# Patient Record
Sex: Female | Born: 1970 | Race: White | Hispanic: No | Smoking: Never smoker
Health system: Southern US, Community
[De-identification: ages and names within clinical notes are randomized; demographics above are authoritative.]

## PROBLEM LIST (undated history)

## (undated) DIAGNOSIS — E119 Type 2 diabetes mellitus without complications: Secondary | ICD-10-CM

## (undated) DIAGNOSIS — C439 Malignant melanoma of skin, unspecified: Secondary | ICD-10-CM

## (undated) DIAGNOSIS — J45909 Unspecified asthma, uncomplicated: Secondary | ICD-10-CM

## (undated) HISTORY — PX: APPENDECTOMY: SHX54

---

## 2002-06-06 ENCOUNTER — Other Ambulatory Visit: Admission: RE | Admit: 2002-06-06 | Discharge: 2002-06-06 | Payer: Self-pay | Admitting: Endocrinology

## 2020-04-02 ENCOUNTER — Emergency Department (HOSPITAL_BASED_OUTPATIENT_CLINIC_OR_DEPARTMENT_OTHER): Payer: BC Managed Care – PPO

## 2020-04-02 ENCOUNTER — Encounter (HOSPITAL_BASED_OUTPATIENT_CLINIC_OR_DEPARTMENT_OTHER): Payer: Self-pay | Admitting: Emergency Medicine

## 2020-04-02 ENCOUNTER — Emergency Department (HOSPITAL_BASED_OUTPATIENT_CLINIC_OR_DEPARTMENT_OTHER)
Admission: EM | Admit: 2020-04-02 | Discharge: 2020-04-02 | Disposition: A | Discharge status: Home / Self Care | Payer: BC Managed Care – PPO | Attending: Emergency Medicine | Admitting: Emergency Medicine

## 2020-04-02 ENCOUNTER — Other Ambulatory Visit: Payer: Self-pay

## 2020-04-02 DIAGNOSIS — E119 Type 2 diabetes mellitus without complications: Secondary | ICD-10-CM | POA: Insufficient documentation

## 2020-04-02 DIAGNOSIS — R0789 Other chest pain: Secondary | ICD-10-CM

## 2020-04-02 DIAGNOSIS — R002 Palpitations: Secondary | ICD-10-CM | POA: Diagnosis not present

## 2020-04-02 DIAGNOSIS — C439 Malignant melanoma of skin, unspecified: Secondary | ICD-10-CM | POA: Insufficient documentation

## 2020-04-02 DIAGNOSIS — R079 Chest pain, unspecified: Secondary | ICD-10-CM | POA: Insufficient documentation

## 2020-04-02 DIAGNOSIS — E039 Hypothyroidism, unspecified: Secondary | ICD-10-CM

## 2020-04-02 DIAGNOSIS — J45909 Unspecified asthma, uncomplicated: Secondary | ICD-10-CM | POA: Diagnosis not present

## 2020-04-02 DIAGNOSIS — R42 Dizziness and giddiness: Secondary | ICD-10-CM | POA: Diagnosis not present

## 2020-04-02 HISTORY — DX: Type 2 diabetes mellitus without complications: E11.9

## 2020-04-02 HISTORY — DX: Unspecified asthma, uncomplicated: J45.909

## 2020-04-02 HISTORY — DX: Malignant melanoma of skin, unspecified: C43.9

## 2020-04-02 LAB — TROPONIN I (HIGH SENSITIVITY)
Troponin I (High Sensitivity): 2 ng/L (ref <18)
Troponin I (High Sensitivity): 3 ng/L (ref <18)

## 2020-04-02 LAB — BASIC METABOLIC PANEL
Anion gap: 11 (ref 5–15)
BUN: 12 mg/dL (ref 6–20)
CO2: 20 mmol/L — ABNORMAL LOW (ref 22–32)
Calcium: 8.8 mg/dL — ABNORMAL LOW (ref 8.9–10.3)
Chloride: 103 mmol/L (ref 98–111)
Creatinine, Ser: 0.76 mg/dL (ref 0.44–1.00)
GFR calc non Af Amer: >60 mL/min (ref >60)
Glucose, Bld: 137 mg/dL — ABNORMAL HIGH (ref 70–99)
Potassium: 3.6 mmol/L (ref 3.5–5.1)
Sodium: 134 mmol/L — ABNORMAL LOW (ref 135–145)

## 2020-04-02 LAB — CBC
HCT: 34.7 % — ABNORMAL LOW (ref 36.0–46.0)
Hemoglobin: 11.1 g/dL — ABNORMAL LOW (ref 12.0–15.0)
MCH: 27.1 pg (ref 26.0–34.0)
MCHC: 32 g/dL (ref 30.0–36.0)
MCV: 84.8 fL (ref 80.0–100.0)
Platelets: 433 10*3/uL — ABNORMAL HIGH (ref 150–400)
RBC: 4.09 MIL/uL (ref 3.87–5.11)
RDW: 13.4 % (ref 11.5–15.5)
WBC: 8.3 10*3/uL (ref 4.0–10.5)
nRBC: 0 % (ref 0.0–0.2)

## 2020-04-02 LAB — TSH: TSH: 5.018 u[IU]/mL — ABNORMAL HIGH (ref 0.350–4.500)

## 2020-04-02 LAB — D-DIMER, QUANTITATIVE: D-Dimer, Quant: 0.92 ug/mL-FEU — ABNORMAL HIGH (ref 0.00–0.50)

## 2020-04-02 MED ORDER — ALBUTEROL SULFATE HFA 108 (90 BASE) MCG/ACT IN AERS
2.0000 | INHALATION_SPRAY | RESPIRATORY_TRACT | Status: DC | PRN
  Administered 2020-04-02: 2 via RESPIRATORY_TRACT
  Filled 2020-04-02: qty 6.7

## 2020-04-02 MED ORDER — IOHEXOL 350 MG/ML SOLN
100.0000 mL | Freq: Once | INTRAVENOUS | Status: AC | PRN
  Administered 2020-04-02: 100 mL via INTRAVENOUS

## 2020-04-02 MED ORDER — ASPIRIN 81 MG PO CHEW
324.0000 mg | CHEWABLE_TABLET | Freq: Once | ORAL | Status: AC
  Administered 2020-04-02: 324 mg via ORAL
  Filled 2020-04-02: qty 4

## 2020-04-02 MED ORDER — ACETAMINOPHEN 500 MG PO TABS
1000.0000 mg | ORAL_TABLET | Freq: Once | ORAL | Status: AC
  Administered 2020-04-02: 1000 mg via ORAL
  Filled 2020-04-02: qty 2

## 2020-04-02 NOTE — ED Provider Notes (Signed)
[facility] EMERGENCY DEPARTMENT Provider Note   CSN: [id number] Arrival date & time: 04/02/20  1043     History Chief Complaint  Patient presents with  . Chest Pain    Emily Barry is a 49 y.o. female who presents emergency department chief complaint of chest pressure.  Patient states that she was awoken from sleep with severe chest pressure.  She states that she felt like she was being bound around her chest.  She had sensation of being unable to take a full breath.  Patient states that over the next several hours he symptoms did not go away and in fact progressively worsened.  As she was sitting at her desk to do work she began feeling her heart palpitate and became lightheaded.  At this time she came to the emergency department seeking evaluation.  She has no previous history of such.  She has no previous history of hypertension.  She has a history of diabetes type 2 and melanoma in situ x2.  She denies a history of hyperthyroidism.  She denies a history of smoking, hyperlipidemia, DVT, PE, exogenous estrogen use, unilateral leg swelling, recent trauma, surgery or confinement.  She has a family history of father who died of massive MI at the age of 85-1/2 brother who had a MI for the age of 11.  HPI  HPI: A 49 year old patient with a history of treated diabetes presents for evaluation of chest pain. Initial onset of pain was less than one hour ago. The patient's chest pain is described as heaviness/pressure/tightness and is not worse with exertion. The patient's chest pain is not middle- or left-sided, is not well-localized, is not sharp and does not radiate to the arms/jaw/neck. The patient does not complain of nausea and denies diaphoresis. The patient has a family history of coronary artery disease in a first-degree relative with onset less than age 54. The patient has no history of stroke, has no history of peripheral artery disease, has not smoked in the past 90 days, is not  hypertensive, has no history of hypercholesterolemia and does not have an elevated BMI (>=30).   Past Medical History:  Diagnosis Date  . Asthma   . Diabetes mellitus without complication ([place])   . Melanoma ([place])     There are no problems to display for this patient.   The histories are not reviewed yet. Please review them in the "History" navigator section and refresh this [place].   OB History   No obstetric history on file.     No family history on file.  Social History   Tobacco Use  . Smoking status: Never Smoker  . Smokeless tobacco: Never Used  Substance Use Topics  . Alcohol use: Not on file  . Drug use: Not on file    Home Medications Prior to Admission medications   Not on File    Allergies    Penicillins  Review of Systems   Review of Systems Ten systems reviewed and are negative for acute change, except as noted in the HPI.   Physical Exam Updated Vital Signs BP (!) 172/89   Pulse 84   Temp 98.2 F (36.8 C) (Oral)   Resp 15   Ht 5\' 3"  (1.6 m)   Wt 65.8 kg   SpO2 100%   BMI 25.69 kg/m   Physical Exam Physical Exam  Nursing note and vitals reviewed. Constitutional: She is oriented to person, place, and time. She appears well-developed and well-nourished. No distress.  HENT:  Head: Normocephalic and atraumatic.  Eyes: Conjunctivae normal and EOM are normal. Pupils are equal, round, and reactive to light. No scleral icterus.  Neck: Normal range of motion.  Cardiovascular: Normal rate, regular rhythm and normal heart sounds.  Exam reveals no gallop and no friction rub.   No murmur heard. Pulmonary/Chest: Effort normal and breath sounds normal. No respiratory distress.  Abdominal: Soft. Bowel sounds are normal. She exhibits no distension and no mass. There is no tenderness. There is no guarding.  Neurological: She is alert and oriented to person, place, and time.  Skin: Skin is warm and dry. She is not diaphoretic.  Psych: Patient appears  relaxed, communicates easily and does not appear anxious.  ED Results / Procedures / Treatments   Labs (all labs ordered are listed, but only abnormal results are displayed) Labs Reviewed  CBC - Abnormal; Notable for the following components:      Result Value   Hemoglobin 11.1 (*)    HCT 34.7 (*)    Platelets 433 (*)    All other components within normal limits  BASIC METABOLIC PANEL  TSH  D-DIMER, QUANTITATIVE (NOT AT [facility])  TROPONIN I (HIGH SENSITIVITY)    EKG EKG Interpretation  Date/Time:  Thursday April 02 2020 10:53:26 EDT Ventricular Rate:  85 PR Interval:  136 QRS Duration: 90 QT Interval:  386 QTC Calculation: 459 R Axis:   71 Text Interpretation: Normal sinus rhythm No previous tracing Confirmed by Lajean Saver [phone]) on 04/02/2020 11:29:48 AM   Radiology DG Chest Port 1 View  Result Date: 04/02/2020 CLINICAL DATA:  Palpitations EXAM: PORTABLE CHEST 1 VIEW COMPARISON:  None. FINDINGS: The heart size and mediastinal contours are within normal limits. Both lungs are clear. The visualized skeletal structures are unremarkable. IMPRESSION: No active disease. Electronically Signed   By: Davina Poke D.O.   On: 04/02/2020 11:35    Procedures Procedures (including critical care time)  Medications Ordered in ED Medications  aspirin chewable tablet 324 mg (324 mg Oral Given 04/02/20 1118)    ED Course  I have reviewed the triage vital signs and the nursing notes.  Pertinent labs & imaging results that were available during my care of the patient were reviewed by me and considered in my medical decision making (see chart for details).  Clinical Course as of Apr 02 1706  Thu Apr 02, 2020  1205 CTA ordered   D-Dimer, America Brown(!): 0.92 [AH]    Clinical Course User Index [AH] Margarita Mail, PA-C   MDM Rules/Calculators/A&P HEAR Score: 2                        Given the large differential diagnosis for Emily Barry, the decision making in this case is  of high complexity. I ordered and reviewed patient's labs which include a CBC which shows mild normocytic anemia, BMP with slightly elevated blood glucose in the setting of type 2 diabetes, elevated D-dimer, normal troponins x2 and a TSH which is elevated suggestive of hypothyroidism.  I ordered and reviewed images which included a chest x-ray and a CT angiogram for pulmonary embolus both of which showed no acute abnormalities.  I ordered and reviewed EKG which shows normal sinus rhythm at a rate of 85  After evaluating all of the data points in this case, the presentation of Emily Barry is NOT consistent with Acute Coronary Syndrome (ACS) and/or myocardial ischemia, pulmonary embolism, aortic dissection; Borhaave's, significant arrythmia, pneumothorax,  cardiac tamponade, or other emergent cardiopulmonary condition.  Further, the presentation of Emily Barry is NOT consistent with pericarditis, myocarditis, cholecystitis, pancreatitis, mediastinitis, endocarditis, new valvular disease.  Additionally, the presentation of Emily M Johnsonis NOT consistent with flail chest, cardiac contusion, ARDS, or significant intra-thoracic or intra-abdominal bleeding.  Moreover, this presentation is NOT consistent with pneumonia, sepsis, or pyelonephritis.    Strict return and follow-up precautions have been given by me personally or by detailed written instruction given verbally by nursing staff using the teach back method to the patient/family/caregiver(s).  Data Reviewed/Counseling: I have reviewed the patient's vital signs, nursing notes, and other relevant tests/information. I had a detailed discussion regarding the historical points, exam findings, and any diagnostic results supporting the discharge diagnosis. I also discussed the need for outpatient follow-up and the need to return to the ED if symptoms worsen or if there are any questions or concerns that arise at home.  Final Clinical  Impression(s) / ED Diagnoses Final diagnoses:  None    Rx / DC Orders ED Discharge Orders    None       Margarita Mail, PA-C 04/02/20 1710    Lajean Saver, MD 04/03/20 [phone]

## 2020-04-02 NOTE — ED Triage Notes (Signed)
Woke up at 5 am with chest tightness and has felt papillations and has had a hard time taking a deep breath , no nausea  Pain in in center of chest

## 2020-04-02 NOTE — Discharge Instructions (Signed)

## 2021-12-04 ENCOUNTER — Other Ambulatory Visit: Payer: Self-pay

## 2021-12-04 ENCOUNTER — Emergency Department (HOSPITAL_BASED_OUTPATIENT_CLINIC_OR_DEPARTMENT_OTHER): Payer: BC Managed Care – PPO | Admitting: Radiology

## 2021-12-04 ENCOUNTER — Emergency Department (HOSPITAL_BASED_OUTPATIENT_CLINIC_OR_DEPARTMENT_OTHER)
Admission: EM | Admit: 2021-12-04 | Discharge: 2021-12-04 | Disposition: A | Discharge status: Home / Self Care | Payer: BC Managed Care – PPO | Attending: Emergency Medicine | Admitting: Emergency Medicine

## 2021-12-04 ENCOUNTER — Encounter (HOSPITAL_BASED_OUTPATIENT_CLINIC_OR_DEPARTMENT_OTHER): Payer: Self-pay | Admitting: Emergency Medicine

## 2021-12-04 DIAGNOSIS — J45909 Unspecified asthma, uncomplicated: Secondary | ICD-10-CM | POA: Diagnosis not present

## 2021-12-04 DIAGNOSIS — E119 Type 2 diabetes mellitus without complications: Secondary | ICD-10-CM | POA: Diagnosis not present

## 2021-12-04 DIAGNOSIS — J9801 Acute bronchospasm: Secondary | ICD-10-CM | POA: Diagnosis not present

## 2021-12-04 DIAGNOSIS — Z85820 Personal history of malignant melanoma of skin: Secondary | ICD-10-CM | POA: Diagnosis not present

## 2021-12-04 DIAGNOSIS — R0602 Shortness of breath: Secondary | ICD-10-CM | POA: Diagnosis present

## 2021-12-04 LAB — CBC
HCT: 37.6 % (ref 36.0–46.0)
Hemoglobin: 12.3 g/dL (ref 12.0–15.0)
MCH: 29.1 pg (ref 26.0–34.0)
MCHC: 32.7 g/dL (ref 30.0–36.0)
MCV: 88.9 fL (ref 80.0–100.0)
Platelets: 404 10*3/uL — ABNORMAL HIGH (ref 150–400)
RBC: 4.23 MIL/uL (ref 3.87–5.11)
RDW: 13 % (ref 11.5–15.5)
WBC: 9.5 10*3/uL (ref 4.0–10.5)
nRBC: 0 % (ref 0.0–0.2)

## 2021-12-04 LAB — BASIC METABOLIC PANEL
Anion gap: 12 (ref 5–15)
BUN: 12 mg/dL (ref 6–20)
CO2: 22 mmol/L (ref 22–32)
Calcium: 9.4 mg/dL (ref 8.9–10.3)
Chloride: 103 mmol/L (ref 98–111)
Creatinine, Ser: 0.66 mg/dL (ref 0.44–1.00)
GFR, Estimated: >60 mL/min (ref >60)
Glucose, Bld: 117 mg/dL — ABNORMAL HIGH (ref 70–99)
Potassium: 3.7 mmol/L (ref 3.5–5.1)
Sodium: 137 mmol/L (ref 135–145)

## 2021-12-04 LAB — PREGNANCY, URINE: Preg Test, Ur: NEGATIVE

## 2021-12-04 LAB — TROPONIN I (HIGH SENSITIVITY)
Troponin I (High Sensitivity): 2 ng/L (ref <18)
Troponin I (High Sensitivity): 2 ng/L (ref <18)

## 2021-12-04 MED ORDER — ALBUTEROL SULFATE HFA 108 (90 BASE) MCG/ACT IN AERS
2.0000 | INHALATION_SPRAY | RESPIRATORY_TRACT | Status: DC | PRN
  Administered 2021-12-04: 2 via RESPIRATORY_TRACT
  Filled 2021-12-04: qty 6.7

## 2021-12-04 MED ORDER — AEROCHAMBER PLUS FLO-VU MISC
1.0000 | Freq: Once | Status: AC
  Administered 2021-12-04: 1
  Filled 2021-12-04: qty 1

## 2021-12-04 MED ORDER — IPRATROPIUM-ALBUTEROL 0.5-2.5 (3) MG/3ML IN SOLN
3.0000 mL | RESPIRATORY_TRACT | Status: DC
  Administered 2021-12-04: 3 mL via RESPIRATORY_TRACT
  Filled 2021-12-04: qty 3

## 2021-12-04 NOTE — ED Provider Notes (Signed)
[facility] Provider Note: Georgena Spurling, MD, FACEP  CSN: [id number] MRN: [id number] ARRIVAL: 12/04/21 at [place]: [facility]   HISTORY OF PRESENT ILLNESS  12/04/21 3:37 AM Emily Barry is a 51 y.o. female who was awakened from sleep just prior to arrival with a sensation that something is struck her in the chest.  This sensation only lasted a few seconds.  She noted that she was also short of breath and tachycardic.  Her heart rate was about 120.  Her shortness of breath and tachycardia have subsequently improved but she is now having some discomfort in her back (between her shoulders) when she breathes.  She has a history of asthma but has not had problems with this since youth.  She is not having any chest pain currently.   Past Medical History:  Diagnosis Date   Asthma    Diabetes mellitus without complication ([place])    Melanoma ([place])     Past Surgical History:  Procedure Laterality Date   APPENDECTOMY      History reviewed. No pertinent family history.  Social History   Tobacco Use   Smoking status: Never   Smokeless tobacco: Never  Substance Use Topics   Alcohol use: Yes    Comment: occasionaly   Drug use: Never    Prior to Admission medications   Not on File    Allergies Penicillins   REVIEW OF SYSTEMS  Negative except as noted here or in the History of Present Illness.   PHYSICAL EXAMINATION  Initial Vital Signs Blood pressure (!) 161/93, pulse 82, temperature 98.6 F (37 C), temperature source Oral, resp. rate 15, height '5\' 3"'$  (1.6 m), weight 61.7 kg, last menstrual period 11/13/2021, SpO2 100 %.  Examination General: Well-developed, well-nourished female in no acute distress; appearance consistent with age of record HENT: normocephalic; atraumatic Eyes: pupils equal, round and reactive to light; extraocular muscles intact Neck: supple Heart: regular rate and rhythm Lungs:  Mildly decreased air movement bilateral Abdomen: soft; nondistended; nontender; no masses or hepatosplenomegaly; bowel sounds present Extremities: No deformity; full range of motion; pulses normal Neurologic: Awake, alert and oriented; motor function intact in all extremities and symmetric; no facial droop Skin: Warm and dry Psychiatric: Normal mood and affect   RESULTS  Summary of this visit's results, reviewed and interpreted by myself:   EKG Interpretation  Date/Time:  Saturday December 04 2021 03:08:47 EDT Ventricular Rate:  74 PR Interval:  143 QRS Duration: 92 QT Interval:  408 QTC Calculation: 453 R Axis:   74 Text Interpretation: Sinus rhythm Baseline wander in lead(s) II III aVF V4 V5 Otherwise normal ECG No significant change was found Confirmed by Shanon Rosser [phone]) on 12/04/2021 3:12:32 AM       Laboratory Studies: Results for orders placed or performed during the hospital encounter of 12/04/21 (from the past 24 hour(s))  Basic metabolic panel     Status: Abnormal   Collection Time: 12/04/21  3:11 AM  Result Value Ref Range   Sodium 137 135 - 145 mmol/L   Potassium 3.7 3.5 - 5.1 mmol/L   Chloride 103 98 - 111 mmol/L   CO2 22 22 - 32 mmol/L   Glucose, Bld 117 (H) 70 - 99 mg/dL   BUN 12 6 - 20 mg/dL   Creatinine, Ser 0.66 0.44 - 1.00 mg/dL   Calcium 9.4 8.9 - 10.3 mg/dL   GFR, Estimated >60 >60 mL/min  Anion gap 12 5 - 15  CBC     Status: Abnormal   Collection Time: 12/04/21  3:11 AM  Result Value Ref Range   WBC 9.5 4.0 - 10.5 K/uL   RBC 4.23 3.87 - 5.11 MIL/uL   Hemoglobin 12.3 12.0 - 15.0 g/dL   HCT 37.6 36.0 - 46.0 %   MCV 88.9 80.0 - 100.0 fL   MCH 29.1 26.0 - 34.0 pg   MCHC 32.7 30.0 - 36.0 g/dL   RDW 13.0 11.5 - 15.5 %   Platelets 404 (H) 150 - 400 K/uL   nRBC 0.0 0.0 - 0.2 %  Troponin I (High Sensitivity)     Status: None   Collection Time: 12/04/21  3:11 AM  Result Value Ref Range   Troponin I (High Sensitivity) 2 <18 ng/L  Pregnancy, urine      Status: None   Collection Time: 12/04/21  3:11 AM  Result Value Ref Range   Preg Test, Ur NEGATIVE NEGATIVE  Troponin I (High Sensitivity)     Status: None   Collection Time: 12/04/21  5:05 AM  Result Value Ref Range   Troponin I (High Sensitivity) 2 <18 ng/L   Imaging Studies: DG Chest 2 View  Result Date: 12/04/2021 CLINICAL DATA:  Shortness of breath, back pain. EXAM: CHEST - 2 VIEW COMPARISON:  04/02/2020. FINDINGS: The heart size and mediastinal contours are within normal limits. Both lungs are clear. No acute osseous abnormality. IMPRESSION: No active cardiopulmonary disease. Electronically Signed   By: Brett Fairy M.D.   On: 12/04/2021 04:41    ED COURSE and MDM  Nursing notes, initial and subsequent vitals signs, including pulse oximetry, reviewed and interpreted by myself.  Vitals:   12/04/21 0415 12/04/21 0430 12/04/21 0454 12/04/21 0530  BP:      Pulse:  82 81 76  Resp: '16 15 18 20  '$ Temp:      TempSrc:      SpO2:  100% 100% 100%  Weight:      Height:       Medications  ipratropium-albuterol (DUONEB) 0.5-2.5 (3) MG/3ML nebulizer solution 3 mL (3 mLs Nebulization Given 12/04/21 0430)  albuterol (VENTOLIN HFA) 108 (90 Base) MCG/ACT inhaler 2 puff (2 puffs Inhalation Given 12/04/21 0449)  aerochamber plus with mask device 1 each (1 each Other Given 12/04/21 0449)   4:44 AM Patient's dyspnea improved, air movement improved after DuoNeb treatment.  I suspect bronchospasm is a significant contributor to the patient's symptomatology.  Her EKG is normal and her first troponin is 2.   5:43 AM Second troponin is also 2.  I doubt cardiac etiology.  Her HEART score is low risk.  There is no hypoxia or tachycardia suggest pulmonary embolism and she has no risk factors.  Her breathing is improved after being given albuterol inhaler/AeroChamber and teaching.  Her back discomfort is also improved.   PROCEDURES  Procedures   ED DIAGNOSES     ICD-10-CM   1. Acute bronchospasm   J98.01         Dracen Reigle, Jenny Reichmann, MD 12/04/21 [phone]

## 2021-12-04 NOTE — ED Notes (Signed)
RT educated pt on proper use of MDI w/spacer. Pt able to perform w/out difficulty. Pt respiratory status stable w/no distress noted at this time. Pt Bilat BS clear throughout.

## 2021-12-04 NOTE — ED Triage Notes (Signed)
Pt via POV from home c/o SOB while sleeping and awaken abruptly this morning. HR at that time was 120, chest pressure now only experiencing back pain between shoulders. Pain continues and SOB. HX Asthma.

## 2022-02-28 IMAGING — DX DG CHEST 1V PORT
1 series · 1 of 1 positions shown · non-contrast
Comparison: None.

CLINICAL DATA: Palpitations

EXAM:
PORTABLE CHEST 1 VIEW

[chest ap]
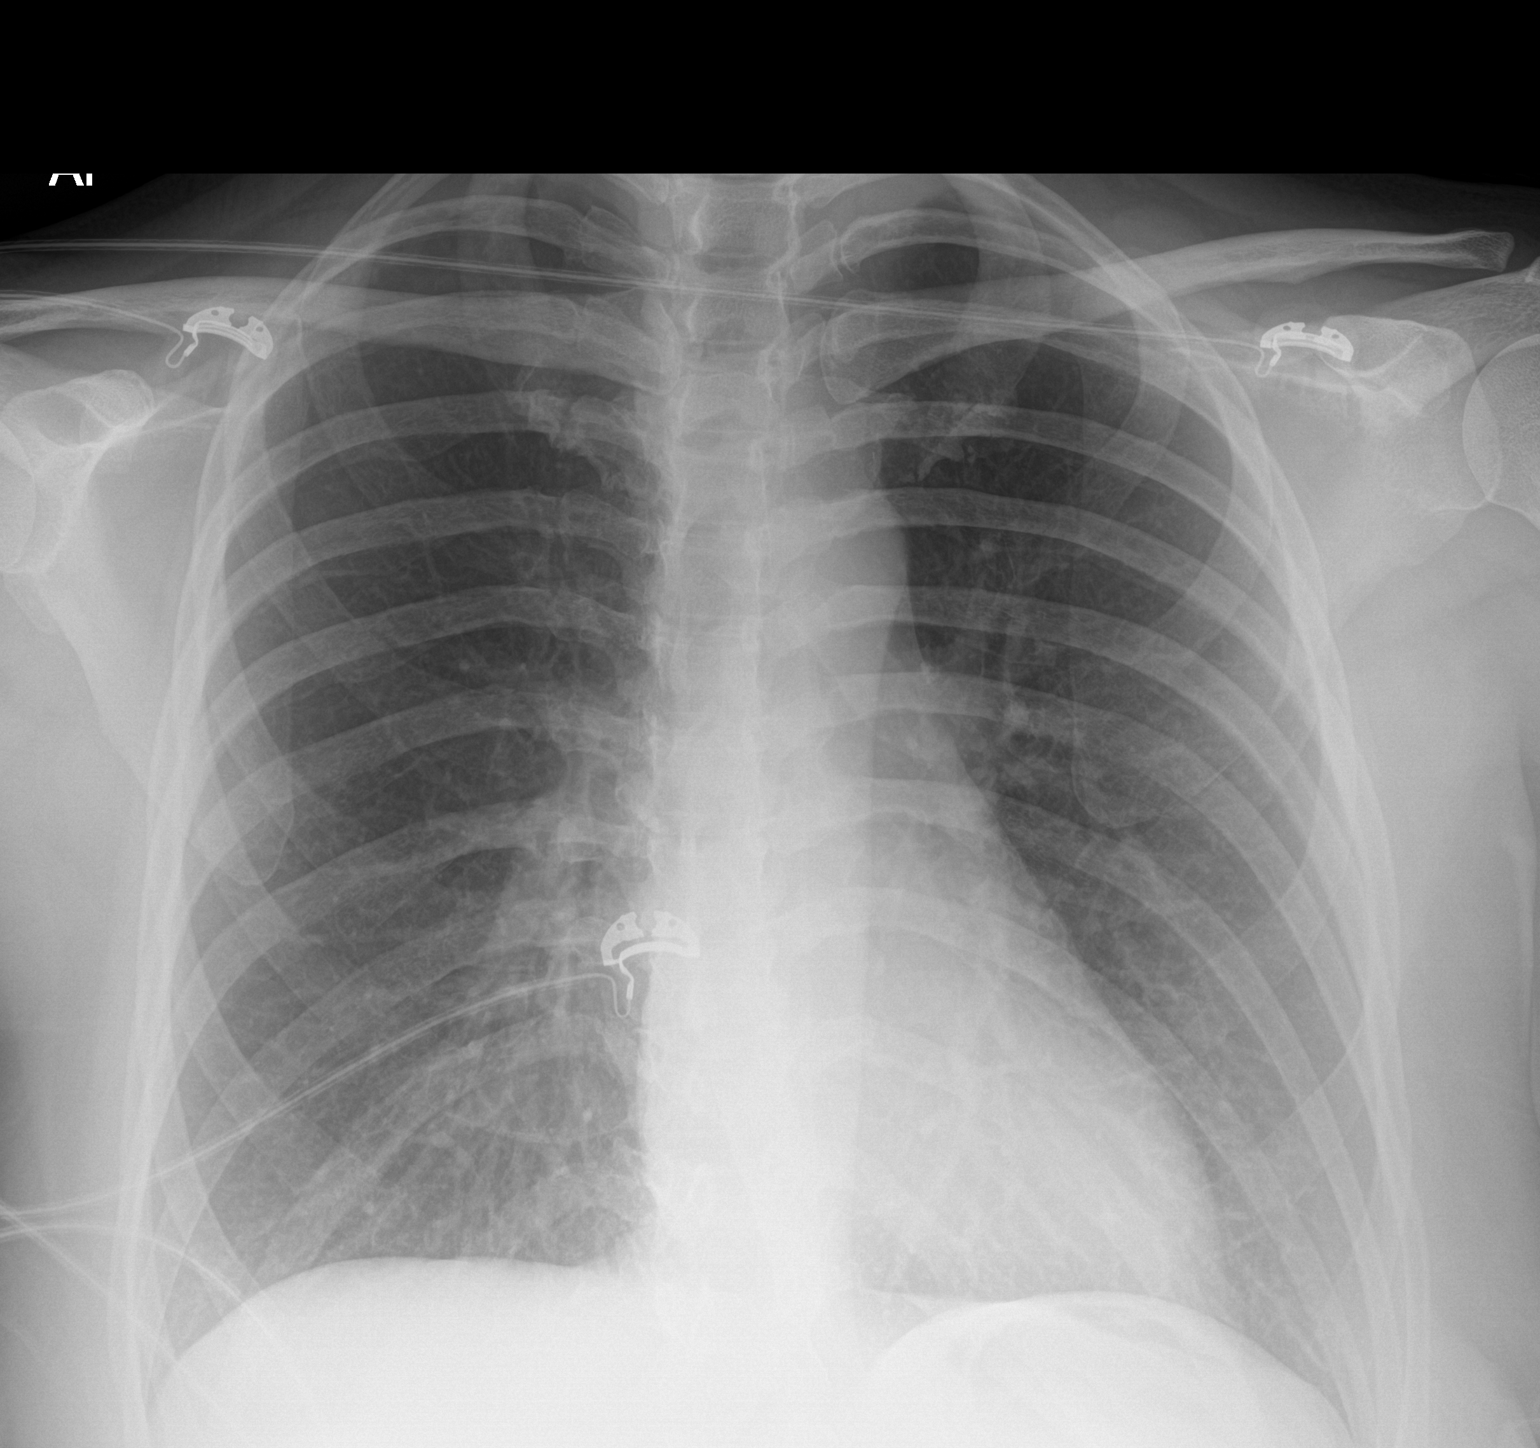

[1 of 1 positions shown; findings below may reference images not displayed]

FINDINGS: The heart size and mediastinal contours are within normal limits.
Both lungs are clear. The visualized skeletal structures are
unremarkable.
IMPRESSION: No active disease.

## 2022-02-28 IMAGING — CT CT ANGIO CHEST
2 of 8 series · 19 of 36 positions shown · IV contrast (Omnipaque)
Comparison: None.

CLINICAL DATA: Chest pain.

EXAM:
CT ANGIOGRAPHY CHEST WITH CONTRAST
TECHNIQUE: Multidetector CT imaging of the chest was performed using the
standard protocol during bolus administration of intravenous
contrast. Multiplanar CT image reconstructions and MIPs were
obtained to evaluate the vascular anatomy.
CONTRAST:  100mL OMNIPAQUE IOHEXOL 350 MG/ML SOLN

[Series 6: pe coronal mpr · coronal · 0.56mm/px · 1 of 114 slices shown]
[im 57/114  mediastinal]
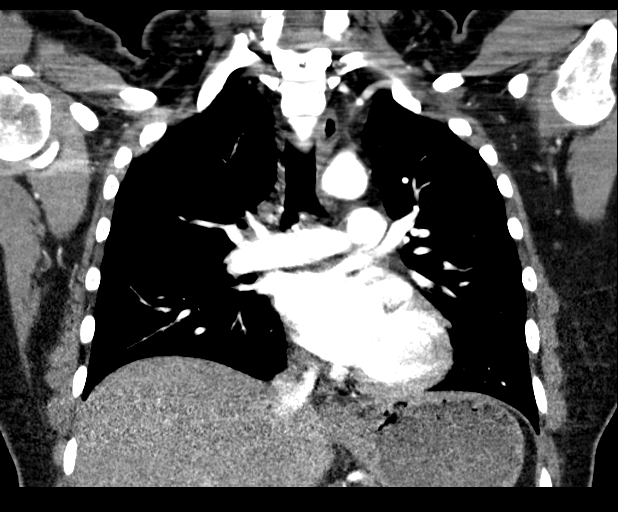

[Series 10: pe thins · axial · 0.69mm/px · z∈[-264,-26]mm · 18 of 266 slices shown]
[im 14/266  lung]
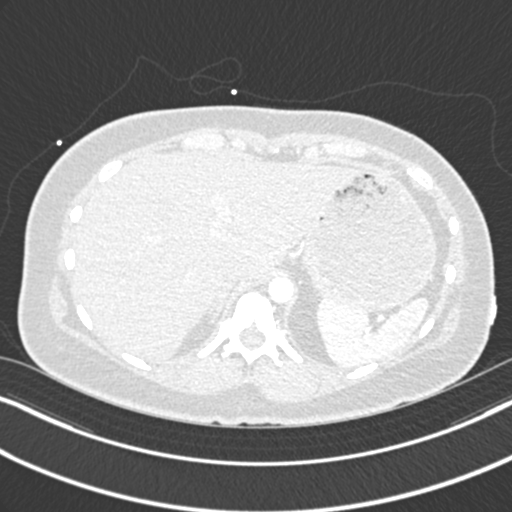
[im 28/266  mediastinal]
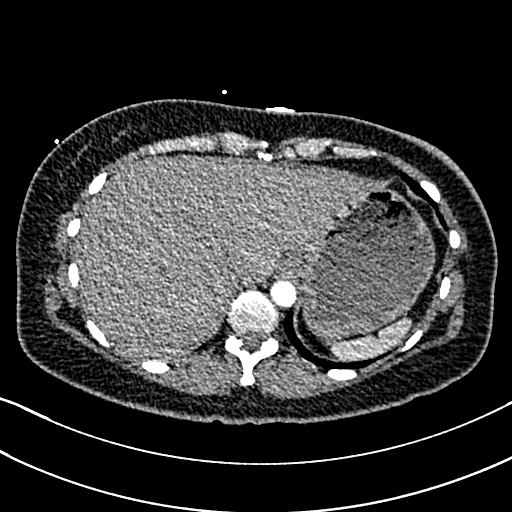
[im 42/266  lung]
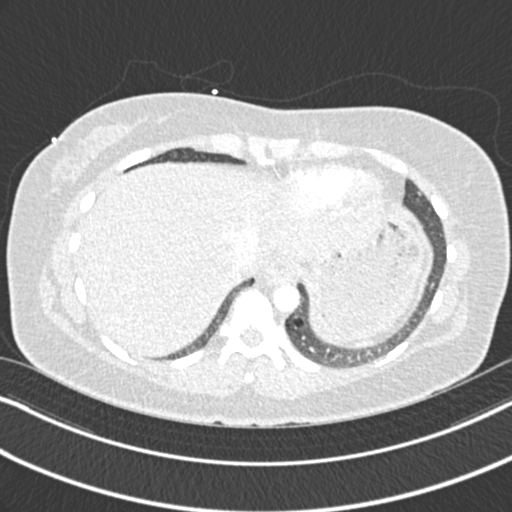
[im 56/266  mediastinal]
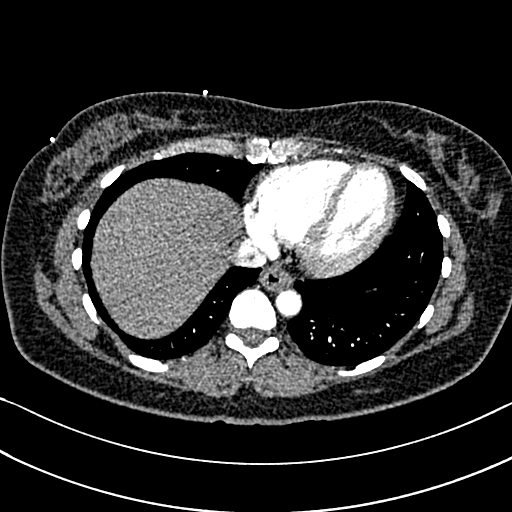
[im 70/266  lung]
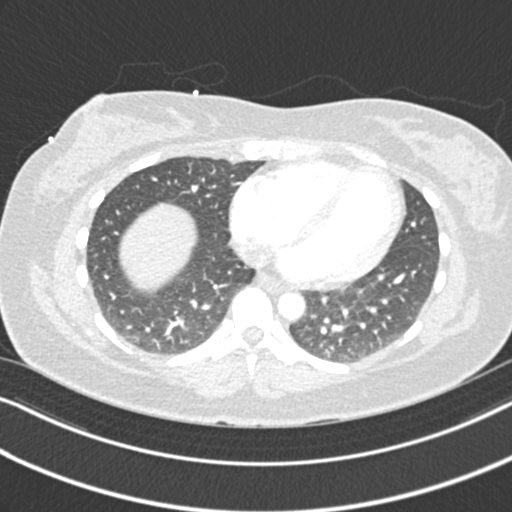
[im 84/266  mediastinal]
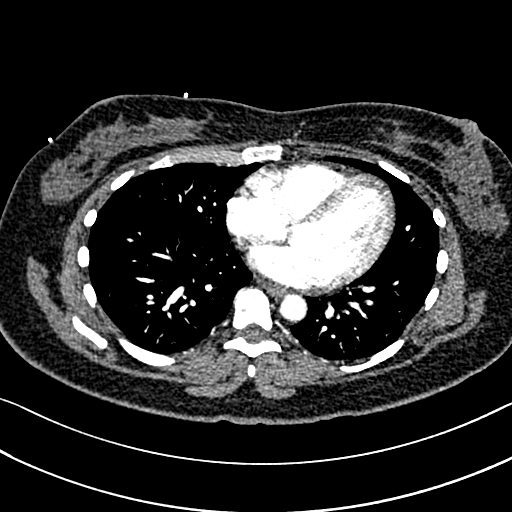
[im 98/266  lung]
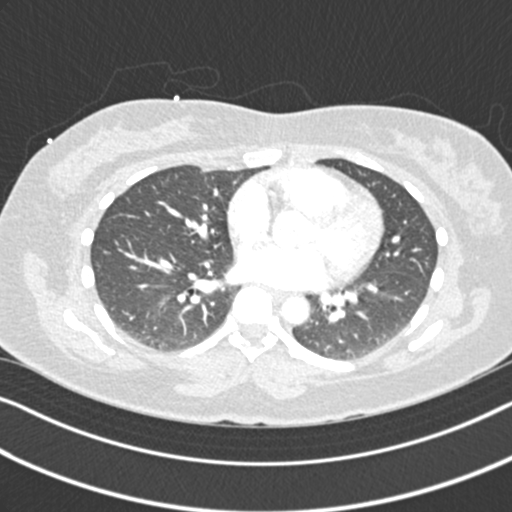
[im 112/266  mediastinal]
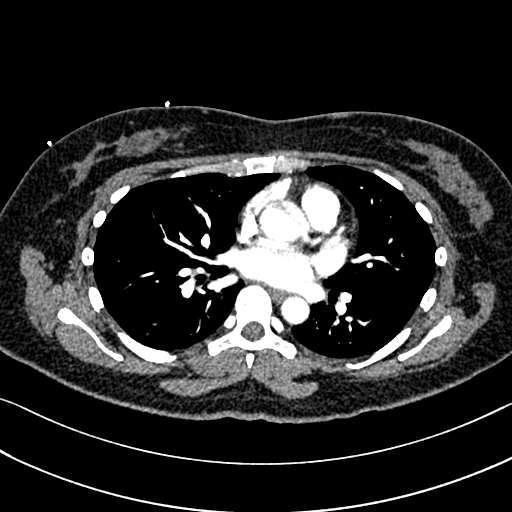
[im 126/266  lung]
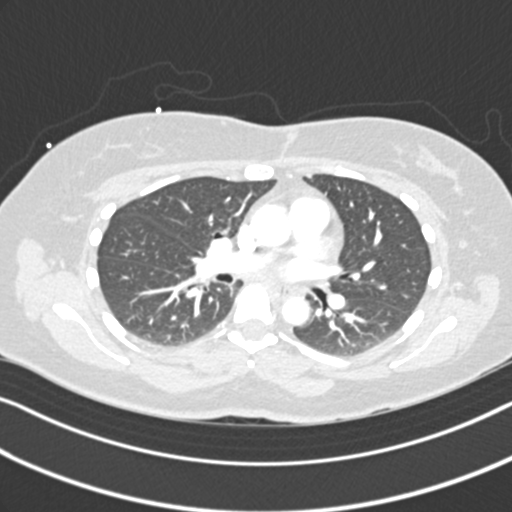
[im 140/266  mediastinal]
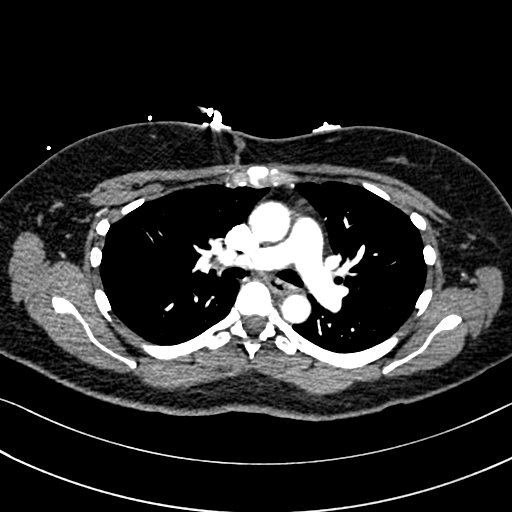
[im 154/266  lung]
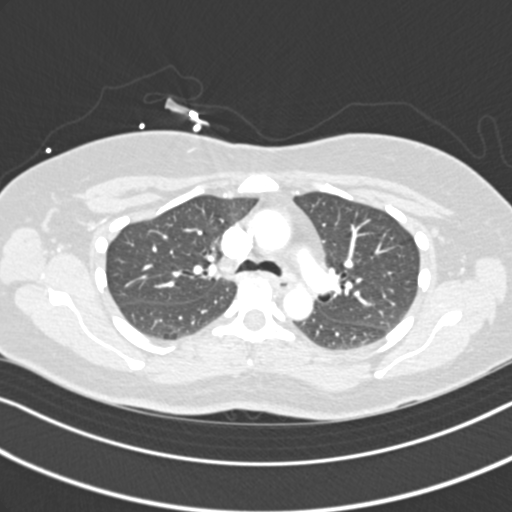
[im 168/266  mediastinal]
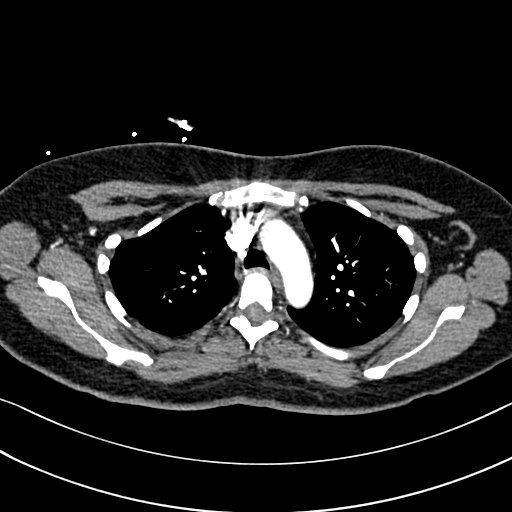
[im 182/266  lung]
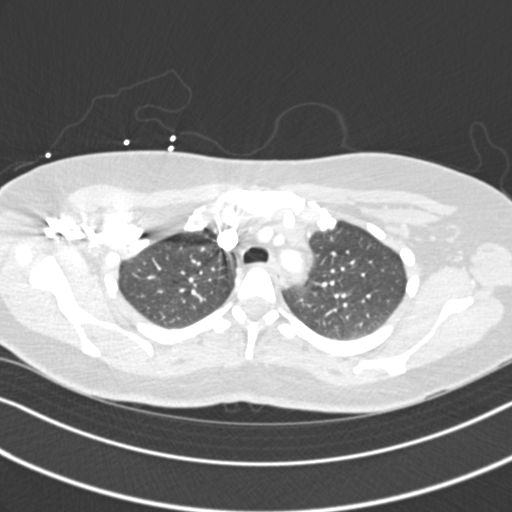
[im 196/266  mediastinal]
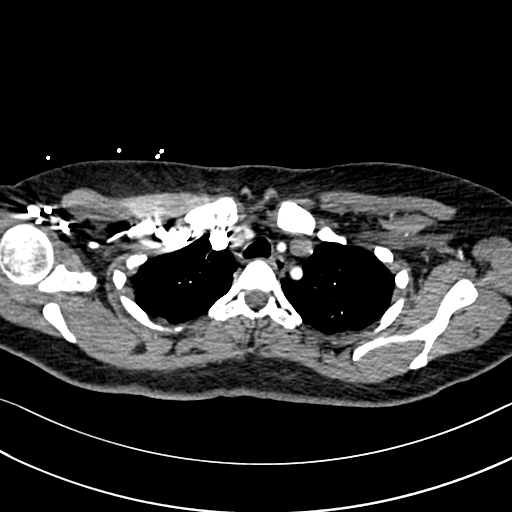
[im 210/266  lung]
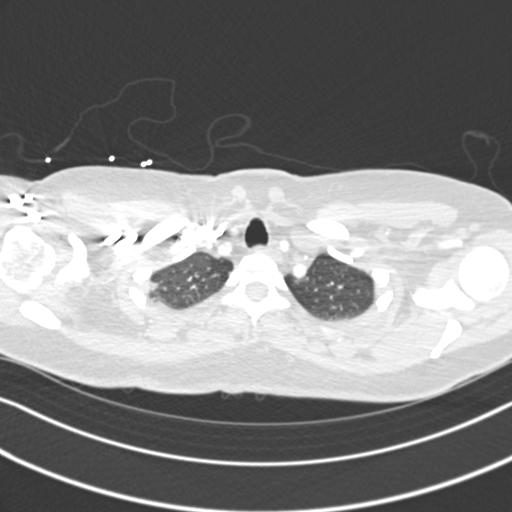
[im 224/266  mediastinal]
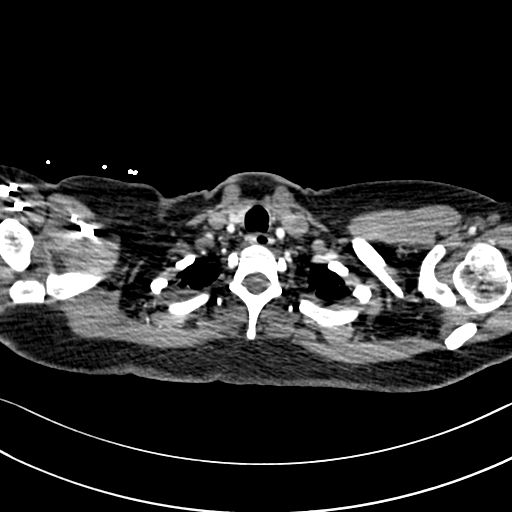
[im 238/266  lung]
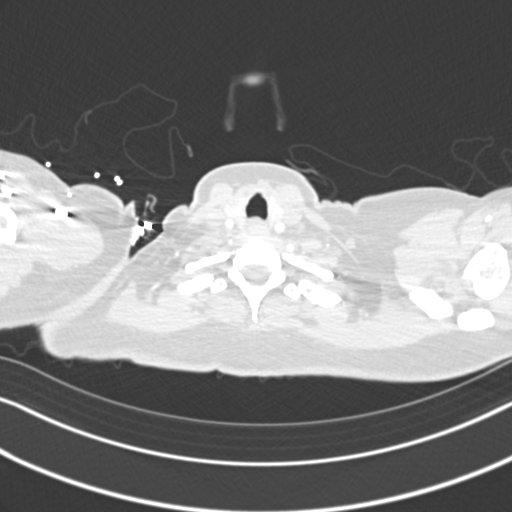
[im 252/266  mediastinal]
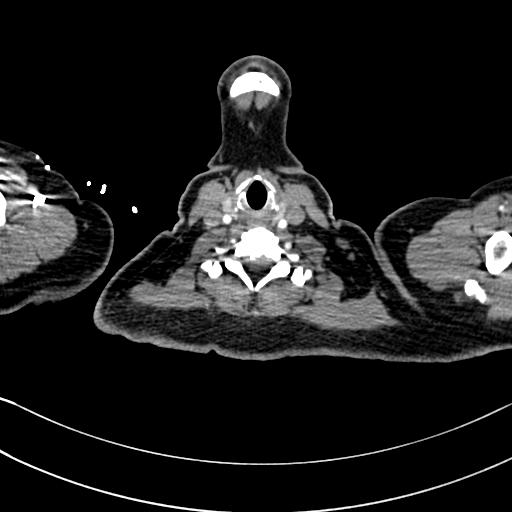

[19 of 36 positions shown; findings below may reference images not displayed]

FINDINGS: Cardiovascular: Satisfactory opacification of the pulmonary arteries
to the segmental level. No evidence of pulmonary embolism. Normal
heart size. No pericardial effusion.

Mediastinum/Nodes: No enlarged mediastinal, hilar, or axillary lymph
nodes. Thyroid gland, trachea, and esophagus demonstrate no
significant findings.

Lungs/Pleura: No pneumothorax or pleural effusion is noted. Right
lung is clear. Several small emphysematous blebs or bulla are noted
in the left lung. No acute pulmonary abnormality is noted.

Upper Abdomen: No acute abnormality.

Musculoskeletal: No chest wall abnormality. No acute or significant
osseous findings.

Review of the MIP images confirms the above findings.
IMPRESSION: 1. No definite evidence of pulmonary embolus.
2. Several small emphysematous blebs or bulla are noted in the left
lung.

Emphysema ([id number]).
